# Patient Record
Sex: Male | Born: 1970 | Race: White | Hispanic: No | Marital: Married | Smoking: Former smoker
Health system: Southern US, Community
[De-identification: ages and names within clinical notes are randomized; demographics above are authoritative.]

## PROBLEM LIST (undated history)

## (undated) DIAGNOSIS — N200 Calculus of kidney: Secondary | ICD-10-CM

## (undated) DIAGNOSIS — D1802 Hemangioma of intracranial structures: Secondary | ICD-10-CM

## (undated) DIAGNOSIS — R569 Unspecified convulsions: Secondary | ICD-10-CM

## (undated) HISTORY — PX: VASECTOMY: SHX75

## (undated) HISTORY — PX: ADENOIDECTOMY: SUR15

---

## 1998-04-08 DIAGNOSIS — D1802 Hemangioma of intracranial structures: Secondary | ICD-10-CM

## 1998-04-08 HISTORY — DX: Hemangioma of intracranial structures: D18.02

## 2000-12-28 ENCOUNTER — Encounter: Payer: Self-pay | Admitting: Emergency Medicine

## 2000-12-28 ENCOUNTER — Emergency Department (HOSPITAL_COMMUNITY): Admission: EM | Admit: 2000-12-28 | Discharge: 2000-12-28 | Payer: Self-pay

## 2001-01-19 ENCOUNTER — Encounter: Payer: Self-pay | Admitting: Urology

## 2001-01-19 ENCOUNTER — Encounter: Admission: RE | Admit: 2001-01-19 | Discharge: 2001-01-19 | Payer: Self-pay | Admitting: Urology

## 2003-05-30 ENCOUNTER — Ambulatory Visit (HOSPITAL_COMMUNITY): Admission: RE | Admit: 2003-05-30 | Discharge: 2003-05-30 | Payer: Self-pay | Admitting: Family Medicine

## 2006-10-22 ENCOUNTER — Ambulatory Visit (HOSPITAL_COMMUNITY): Admission: RE | Admit: 2006-10-22 | Discharge: 2006-10-22 | Payer: Self-pay | Admitting: Family Medicine

## 2007-04-09 HISTORY — PX: EYE SURGERY: SHX253

## 2010-04-28 ENCOUNTER — Encounter: Payer: Self-pay | Admitting: Family Medicine

## 2010-12-25 ENCOUNTER — Other Ambulatory Visit: Payer: Self-pay | Admitting: Otolaryngology

## 2010-12-25 DIAGNOSIS — R51 Headache: Secondary | ICD-10-CM

## 2010-12-27 ENCOUNTER — Ambulatory Visit
Admission: RE | Admit: 2010-12-27 | Discharge: 2010-12-27 | Disposition: A | Discharge status: Home / Self Care | Payer: BC Managed Care – PPO | Source: Ambulatory Visit | Attending: Otolaryngology | Admitting: Otolaryngology

## 2010-12-27 DIAGNOSIS — R51 Headache: Secondary | ICD-10-CM

## 2010-12-27 MED ORDER — GADOBENATE DIMEGLUMINE 529 MG/ML IV SOLN
14.0000 mL | Freq: Once | INTRAVENOUS | Status: AC | PRN
  Administered 2010-12-27: 14 mL via INTRAVENOUS

## 2011-04-09 HISTORY — PX: SINUS EXPLORATION: SHX5214

## 2012-11-11 ENCOUNTER — Telehealth (INDEPENDENT_AMBULATORY_CARE_PROVIDER_SITE_OTHER): Payer: Self-pay

## 2012-11-11 ENCOUNTER — Encounter (INDEPENDENT_AMBULATORY_CARE_PROVIDER_SITE_OTHER): Payer: Self-pay | Admitting: General Surgery

## 2012-11-11 ENCOUNTER — Ambulatory Visit (INDEPENDENT_AMBULATORY_CARE_PROVIDER_SITE_OTHER): Payer: BC Managed Care – PPO | Admitting: General Surgery

## 2012-11-11 VITALS — BP 108/70 | HR 58 | Resp 16 | Ht 68.0 in | Wt 147.0 lb

## 2012-11-11 DIAGNOSIS — K649 Unspecified hemorrhoids: Secondary | ICD-10-CM

## 2012-11-11 MED ORDER — HYDROCORTISONE 2.5 % RE CREA: TOPICAL_CREAM | Freq: Two times a day (BID) | RECTAL | Status: AC

## 2012-11-11 NOTE — Patient Instructions (Signed)
Hemorrhoids Hemorrhoids are swollen veins around the rectum or anus. There are two types of hemorrhoids:   Internal hemorrhoids. These occur in the veins just inside the rectum. They may poke through to the outside and become irritated and painful.  External hemorrhoids. These occur in the veins outside the anus and can be felt as a painful swelling or hard lump near the anus. CAUSES  Pregnancy.   Obesity.   Constipation or diarrhea.   Straining to have a bowel movement.   Sitting for long periods on the toilet.  Heavy lifting or other activity that caused you to strain.  Anal intercourse. SYMPTOMS   Pain.   Anal itching or irritation.   Rectal bleeding.   Fecal leakage.   Anal swelling.   One or more lumps around the anus.  DIAGNOSIS  Your caregiver may be able to diagnose hemorrhoids by visual examination. Other examinations or tests that may be performed include:   Examination of the rectal area with a gloved hand (digital rectal exam).   Examination of anal canal using a small tube (scope).   A blood test if you have lost a significant amount of blood.  A test to look inside the colon (sigmoidoscopy or colonoscopy). TREATMENT Most hemorrhoids can be treated at home. However, if symptoms do not seem to be getting better or if you have a lot of rectal bleeding, your caregiver may perform a procedure to help make the hemorrhoids get smaller or remove them completely. Possible treatments include:   Placing a rubber band at the base of the hemorrhoid to cut off the circulation (rubber band ligation).   Injecting a chemical to shrink the hemorrhoid (sclerotherapy).   Using a tool to burn the hemorrhoid (infrared light therapy).   Surgically removing the hemorrhoid (hemorrhoidectomy).   Stapling the hemorrhoid to block blood flow to the tissue (hemorrhoid stapling).  HOME CARE INSTRUCTIONS   Eat foods with fiber, such as whole grains, beans,  nuts, fruits, and vegetables. Ask your doctor about taking products with added fiber in them (fibersupplements).  Increase fluid intake. Drink enough water and fluids to keep your urine clear or pale yellow.   Exercise regularly.   Go to the bathroom when you have the urge to have a bowel movement. Do not wait.   Avoid straining to have bowel movements.   Keep the anal area dry and clean. Use wet toilet paper or moist towelettes after a bowel movement.   Medicated creams and suppositories may be used or applied as directed.   Only take over-the-counter or prescription medicines as directed by your caregiver.   Take warm sitz baths for 15 20 minutes, 3 4 times a day to ease pain and discomfort.   Place ice packs on the hemorrhoids if they are tender and swollen. Using ice packs between sitz baths may be helpful.   Put ice in a plastic bag.   Place a towel between your skin and the bag.   Leave the ice on for 15 20 minutes, 3 4 times a day.   Do not use a donut-shaped pillow or sit on the toilet for long periods. This increases blood pooling and pain.  SEEK MEDICAL CARE IF:  You have increasing pain and swelling that is not controlled by treatment or medicine.  You have uncontrolled bleeding.  You have difficulty or you are unable to have a bowel movement.  You have pain or inflammation outside the area of the hemorrhoids. MAKE SURE YOU:    Understand these instructions.  Will watch your condition.  Will get help right away if you are not doing well or get worse. Document Released: 03/22/2000 Document Revised: 03/11/2012 Document Reviewed: 01/28/2012 [employer] Patient Information 2014 [employer].  

## 2012-11-11 NOTE — Telephone Encounter (Signed)
Called and spoke to [place] in medical records requesting a copy of patient referral records to be faxed to our office for appointment today @ 9:15 am

## 2012-11-11 NOTE — Progress Notes (Signed)
Subjective:   hemorrhoids anal discomfort  Patient ID: Joseph Wiley, male   DOB: [birth year], 42 y.o.   MRN: [id number]  HPI Patient is a 42 year old male referred by Dr. Thea Silversmith do to anal discomfort and probable hemorrhoids. The patient states he has had symptoms since last fall, almost one year. He describes mainly anal discomfort. This is not severe pain but poorly characterized possibly pressure. It is worse with physical activity. He has an occasional small amount of bleeding or drainage. Bowel movements are not painful. He denies diarrhea or significant constipation. He has used over-the-counter medication and sitz baths with actually some relief in the last few weeks. He has no previous history of similar symptoms or any GI complaints or previous surgical medical treatment for her anorectal disease.  History reviewed. No pertinent past medical history. Past Surgical History  Procedure Laterality Date  . Sinus exploration     No current outpatient prescriptions on file.   No current facility-administered medications for this visit.   No Known Allergies History  Substance Use Topics  . Smoking status: Former Smoker    Quit date: 11/11/1997  . Smokeless tobacco: Never Used  . Alcohol Use: 1.2 oz/week    2 Cans of beer per week     Review of Systems  Gastrointestinal: Positive for anal bleeding. Negative for nausea, abdominal pain, diarrhea and constipation.       Objective:   Physical Exam BP 108/70  Pulse 58  Resp 16  Ht 5\' 8"  (1.727 m)  Wt 147 lb (66.679 kg)  BMI 22.36 kg/m2 General: Thin fit appearing Caucasian male in no distress Abdomen: Soft and nontender. No masses or organomegaly. Rectal: There are some minimal noninflamed external skin tags on external exam. Digital exam unremarkable. No masses or tenderness or blood.  Anoscopy was performed. There are some mild to moderate internal hemorrhoids without bleeding or inflammation. No fissure or other  abnormality    Assessment:     Rectal discomfort. Possible skin tags versus hemorrhoids. On my exam he does not have significant external hemorrhoids. He is feeling better currently with nonsurgical management.    Plan:     For now I would not recommend surgical treatment. We will try Anusol HC cream as needed. He'll take a stool softener daily. We discussed hygiene. I given an appointment for 2 months for followup that he can cancel if he is feeling significantly better.

## 2013-01-08 ENCOUNTER — Encounter (INDEPENDENT_AMBULATORY_CARE_PROVIDER_SITE_OTHER): Payer: BC Managed Care – PPO | Admitting: General Surgery

## 2013-01-14 ENCOUNTER — Encounter (INDEPENDENT_AMBULATORY_CARE_PROVIDER_SITE_OTHER): Payer: Self-pay | Admitting: General Surgery

## 2013-08-11 ENCOUNTER — Other Ambulatory Visit: Payer: Self-pay | Admitting: Orthopaedic Surgery

## 2013-08-11 DIAGNOSIS — M25511 Pain in right shoulder: Secondary | ICD-10-CM

## 2013-08-14 ENCOUNTER — Ambulatory Visit
Admission: RE | Admit: 2013-08-14 | Discharge: 2013-08-14 | Disposition: A | Discharge status: Home / Self Care | Payer: BC Managed Care – PPO | Source: Ambulatory Visit | Attending: Orthopaedic Surgery | Admitting: Orthopaedic Surgery

## 2013-08-14 DIAGNOSIS — M25511 Pain in right shoulder: Secondary | ICD-10-CM

## 2013-08-16 ENCOUNTER — Other Ambulatory Visit: Payer: Self-pay

## 2013-08-18 ENCOUNTER — Other Ambulatory Visit: Payer: Self-pay | Admitting: Orthopedic Surgery

## 2013-08-18 DIAGNOSIS — R29898 Other symptoms and signs involving the musculoskeletal system: Secondary | ICD-10-CM

## 2013-08-22 ENCOUNTER — Ambulatory Visit
Admission: RE | Admit: 2013-08-22 | Discharge: 2013-08-22 | Disposition: A | Discharge status: Home / Self Care | Payer: BC Managed Care – PPO | Source: Ambulatory Visit | Attending: Orthopedic Surgery | Admitting: Orthopedic Surgery

## 2013-08-22 DIAGNOSIS — R29898 Other symptoms and signs involving the musculoskeletal system: Secondary | ICD-10-CM

## 2013-08-27 ENCOUNTER — Other Ambulatory Visit: Payer: Self-pay | Admitting: Neurological Surgery

## 2013-09-15 ENCOUNTER — Other Ambulatory Visit (HOSPITAL_COMMUNITY): Payer: Self-pay | Admitting: *Deleted

## 2013-09-15 ENCOUNTER — Encounter (HOSPITAL_COMMUNITY): Payer: Self-pay

## 2013-09-15 ENCOUNTER — Encounter (HOSPITAL_COMMUNITY)
Admission: RE | Admit: 2013-09-15 | Discharge: 2013-09-15 | Disposition: A | Discharge status: Home / Self Care | Payer: BC Managed Care – PPO | Source: Ambulatory Visit | Attending: Neurological Surgery | Admitting: Neurological Surgery

## 2013-09-15 ENCOUNTER — Encounter (HOSPITAL_COMMUNITY): Payer: Self-pay | Admitting: Pharmacy Technician

## 2013-09-15 ENCOUNTER — Ambulatory Visit (HOSPITAL_COMMUNITY)
Admission: RE | Admit: 2013-09-15 | Discharge: 2013-09-15 | Disposition: A | Discharge status: Home / Self Care | Payer: BC Managed Care – PPO | Source: Ambulatory Visit | Attending: Neurological Surgery | Admitting: Neurological Surgery

## 2013-09-15 DIAGNOSIS — Z01812 Encounter for preprocedural laboratory examination: Secondary | ICD-10-CM | POA: Insufficient documentation

## 2013-09-15 DIAGNOSIS — Z87891 Personal history of nicotine dependence: Secondary | ICD-10-CM | POA: Insufficient documentation

## 2013-09-15 DIAGNOSIS — Z01818 Encounter for other preprocedural examination: Secondary | ICD-10-CM | POA: Insufficient documentation

## 2013-09-15 HISTORY — DX: Unspecified convulsions: R56.9

## 2013-09-15 HISTORY — DX: Hemangioma of intracranial structures: D18.02

## 2013-09-15 HISTORY — DX: Calculus of kidney: N20.0

## 2013-09-15 LAB — CBC WITH DIFFERENTIAL/PLATELET
BASOS ABS: 0 10*3/uL (ref 0.0–0.1)
BASOS PCT: 1 % (ref 0–1)
EOS ABS: 0.4 10*3/uL (ref 0.0–0.7)
EOS PCT: 6 % — AB (ref 0–5)
HCT: 42.8 % (ref 39.0–52.0)
Hemoglobin: 15 g/dL (ref 13.0–17.0)
LYMPHS PCT: 33 % (ref 12–46)
Lymphs Abs: 2.1 10*3/uL (ref 0.7–4.0)
MCH: 31.9 pg (ref 26.0–34.0)
MCHC: 35 g/dL (ref 30.0–36.0)
MCV: 91.1 fL (ref 78.0–100.0)
Monocytes Absolute: 0.6 10*3/uL (ref 0.1–1.0)
Monocytes Relative: 9 % (ref 3–12)
Neutro Abs: 3.3 10*3/uL (ref 1.7–7.7)
Neutrophils Relative %: 52 % (ref 43–77)
PLATELETS: 117 10*3/uL — AB (ref 150–400)
RBC: 4.7 MIL/uL (ref 4.22–5.81)
RDW: 12.5 % (ref 11.5–15.5)
WBC: 6.4 10*3/uL (ref 4.0–10.5)

## 2013-09-15 LAB — BASIC METABOLIC PANEL
BUN: 16 mg/dL (ref 6–23)
CALCIUM: 9.6 mg/dL (ref 8.4–10.5)
CO2: 29 meq/L (ref 19–32)
CREATININE: 0.83 mg/dL (ref 0.50–1.35)
Chloride: 103 mEq/L (ref 96–112)
GFR calc Af Amer: >90 mL/min (ref >90)
GFR calc non Af Amer: >90 mL/min (ref >90)
Glucose, Bld: 72 mg/dL (ref 70–99)
Potassium: 4.6 mEq/L (ref 3.7–5.3)
Sodium: 142 mEq/L (ref 137–147)

## 2013-09-15 LAB — PROTIME-INR
INR: 1.03 (ref 0.00–1.49)
PROTHROMBIN TIME: 13.3 s (ref 11.6–15.2)

## 2013-09-15 LAB — SURGICAL PCR SCREEN
MRSA, PCR: NEGATIVE
STAPHYLOCOCCUS AUREUS: NEGATIVE

## 2013-09-15 NOTE — Pre-Procedure Instructions (Signed)
Joseph Wiley  09/15/2013   Your procedure is scheduled on:  Friday, September 24, 2013 at 7:30 AM.   Report to [facility] Entrance "A" Admitting Office at 5:30 AM.   Call this number if you have problems the morning of surgery: [phone]   Remember:   Do not eat food or drink liquids after midnight Thursday, 09/23/13.   Take these medicines the morning of surgery with A SIP OF WATER: NONE  Stop Ibuprofen as of Friday, 09/17/13.   Do not wear jewelry.  Do not wear lotions, powders, or cologne. You may wear deodorant.  Men may shave face and neck.  Do not bring valuables to the hospital.  [facility] is not responsible                  for any belongings or valuables.               Contacts, dentures or bridgework may not be worn into surgery.  Leave suitcase in the car. After surgery it may be brought to your room.  For patients admitted to the hospital, discharge time is determined by your                treatment team.              Special Instructions: [facility] - Preparing for Surgery  Before surgery, you can play an important role.  Because skin is not sterile, your skin needs to be as free of germs as possible.  You can reduce the number of germs on you skin by washing with CHG (chlorahexidine gluconate) soap before surgery.  CHG is an antiseptic cleaner which kills germs and bonds with the skin to continue killing germs even after washing.  Please DO NOT use if you have an allergy to CHG or antibacterial soaps.  If your skin becomes reddened/irritated stop using the CHG and inform your nurse when you arrive at Short Stay.  Do not shave (including legs and underarms) for at least 48 hours prior to the first CHG shower.  You may shave your face.  Please follow these instructions carefully:   1.  Shower with CHG Soap the night before surgery and the                                morning of Surgery.  2.  If you choose to wash your hair, wash your hair first as usual  with your       normal shampoo.  3.  After you shampoo, rinse your hair and body thoroughly to remove the                      Shampoo.  4.  Use CHG as you would any other liquid soap.  You can apply chg directly       to the skin and wash gently with scrungie or a clean washcloth.  5.  Apply the CHG Soap to your body ONLY FROM THE NECK DOWN.        Do not use on open wounds or open sores.  Avoid contact with your eyes, ears, mouth and genitals (private parts).  Wash genitals (private parts) with your normal soap.  6.  Wash thoroughly, paying special attention to the area where your surgery        will be performed.  7.  Thoroughly rinse your  body with warm water from the neck down.  8.  DO NOT shower/wash with your normal soap after using and rinsing off       the CHG Soap.  9.  Pat yourself dry with a clean towel.            10.  Wear clean pajamas.            11.  Place clean sheets on your bed the night of your first shower and do not        sleep with pets.  Day of Surgery  Do not apply any lotions the morning of surgery.  Please wear clean clothes to the hospital/surgery center.     Please read over the following fact sheets that you were given: Pain Booklet, Coughing and Deep Breathing, MRSA Information and Surgical Site Infection Prevention

## 2013-09-23 MED ORDER — CEFAZOLIN SODIUM-DEXTROSE 2-3 GM-% IV SOLR
2.0000 g | INTRAVENOUS | Status: DC
  Filled 2013-09-23: qty 50

## 2013-09-24 ENCOUNTER — Inpatient Hospital Stay (HOSPITAL_COMMUNITY): Payer: BC Managed Care – PPO

## 2013-09-24 ENCOUNTER — Encounter (HOSPITAL_COMMUNITY): Payer: BC Managed Care – PPO | Admitting: Anesthesiology

## 2013-09-24 ENCOUNTER — Ambulatory Visit (HOSPITAL_COMMUNITY)
Admission: RE | Admit: 2013-09-24 | Discharge: 2013-09-25 | Disposition: A | Discharge status: Home / Self Care | Payer: BC Managed Care – PPO | Source: Ambulatory Visit | Attending: Neurological Surgery | Admitting: Neurological Surgery

## 2013-09-24 ENCOUNTER — Encounter (HOSPITAL_COMMUNITY)
Admission: RE | Disposition: A | Discharge status: Home / Self Care | Payer: Self-pay | Source: Ambulatory Visit | Attending: Neurological Surgery

## 2013-09-24 ENCOUNTER — Inpatient Hospital Stay (HOSPITAL_COMMUNITY): Payer: BC Managed Care – PPO | Admitting: Anesthesiology

## 2013-09-24 ENCOUNTER — Encounter (HOSPITAL_COMMUNITY): Payer: Self-pay | Admitting: Neurological Surgery

## 2013-09-24 DIAGNOSIS — M5020 Other cervical disc displacement, unspecified cervical region: Secondary | ICD-10-CM | POA: Insufficient documentation

## 2013-09-24 DIAGNOSIS — M47812 Spondylosis without myelopathy or radiculopathy, cervical region: Principal | ICD-10-CM | POA: Insufficient documentation

## 2013-09-24 DIAGNOSIS — Z87891 Personal history of nicotine dependence: Secondary | ICD-10-CM | POA: Insufficient documentation

## 2013-09-24 DIAGNOSIS — Z981 Arthrodesis status: Secondary | ICD-10-CM

## 2013-09-24 HISTORY — PX: ANTERIOR CERVICAL DECOMP/DISCECTOMY FUSION: SHX1161

## 2013-09-24 SURGERY — ANTERIOR CERVICAL DECOMPRESSION/DISCECTOMY FUSION 3 LEVELS
Anesthesia: General

## 2013-09-24 MED ORDER — DEXAMETHASONE SODIUM PHOSPHATE 4 MG/ML IJ SOLN
4.0000 mg | Freq: Four times a day (QID) | INTRAMUSCULAR | Status: DC
  Administered 2013-09-24: 4 mg via INTRAVENOUS
  Filled 2013-09-24 (×4): qty 1

## 2013-09-24 MED ORDER — ONDANSETRON HCL 4 MG/2ML IJ SOLN
INTRAMUSCULAR | Status: AC
  Filled 2013-09-24: qty 2

## 2013-09-24 MED ORDER — NEOSTIGMINE METHYLSULFATE 10 MG/10ML IV SOLN
INTRAVENOUS | Status: DC | PRN
  Administered 2013-09-24: 3 mg via INTRAVENOUS

## 2013-09-24 MED ORDER — THROMBIN 5000 UNITS EX SOLR
OROMUCOSAL | Status: DC | PRN
  Administered 2013-09-24: 09:00:00 via TOPICAL

## 2013-09-24 MED ORDER — ROCURONIUM BROMIDE 50 MG/5ML IV SOLN
INTRAVENOUS | Status: AC
  Filled 2013-09-24: qty 1

## 2013-09-24 MED ORDER — LACTATED RINGERS IV SOLN
INTRAVENOUS | Status: DC | PRN
  Administered 2013-09-24 (×2): via INTRAVENOUS

## 2013-09-24 MED ORDER — SODIUM CHLORIDE 0.9 % IJ SOLN
3.0000 mL | Freq: Two times a day (BID) | INTRAMUSCULAR | Status: DC
  Administered 2013-09-24 (×2): 3 mL via INTRAVENOUS

## 2013-09-24 MED ORDER — FENTANYL CITRATE 0.05 MG/ML IJ SOLN
INTRAMUSCULAR | Status: DC | PRN
  Administered 2013-09-24 (×3): 50 ug via INTRAVENOUS
  Administered 2013-09-24: 100 ug via INTRAVENOUS
  Administered 2013-09-24: 50 ug via INTRAVENOUS

## 2013-09-24 MED ORDER — MENTHOL 3 MG MT LOZG
1.0000 | LOZENGE | OROMUCOSAL | Status: DC | PRN
  Administered 2013-09-24: 3 mg via ORAL
  Filled 2013-09-24: qty 9

## 2013-09-24 MED ORDER — LIDOCAINE HCL (CARDIAC) 20 MG/ML IV SOLN
INTRAVENOUS | Status: AC
  Filled 2013-09-24: qty 5

## 2013-09-24 MED ORDER — SODIUM CHLORIDE 0.9 % IJ SOLN: 3.0000 mL | INTRAMUSCULAR | Status: DC | PRN

## 2013-09-24 MED ORDER — OXYCODONE HCL 5 MG/5ML PO SOLN: 5.0000 mg | Freq: Once | ORAL | Status: DC | PRN

## 2013-09-24 MED ORDER — SODIUM CHLORIDE 0.9 % IJ SOLN
INTRAMUSCULAR | Status: AC
  Filled 2013-09-24: qty 10

## 2013-09-24 MED ORDER — ACETAMINOPHEN 10 MG/ML IV SOLN
INTRAVENOUS | Status: AC
  Administered 2013-09-24: 1000 mg via INTRAVENOUS
  Filled 2013-09-24: qty 100

## 2013-09-24 MED ORDER — ARTIFICIAL TEARS OP OINT
TOPICAL_OINTMENT | OPHTHALMIC | Status: AC
  Filled 2013-09-24: qty 3.5

## 2013-09-24 MED ORDER — PHENOL 1.4 % MT LIQD: 1.0000 | OROMUCOSAL | Status: DC | PRN

## 2013-09-24 MED ORDER — OXYCODONE HCL 5 MG PO TABS: 5.0000 mg | ORAL_TABLET | Freq: Once | ORAL | Status: DC | PRN

## 2013-09-24 MED ORDER — PROPOFOL 10 MG/ML IV BOLUS
INTRAVENOUS | Status: AC
  Filled 2013-09-24: qty 20

## 2013-09-24 MED ORDER — ACETAMINOPHEN 10 MG/ML IV SOLN
1000.0000 mg | Freq: Four times a day (QID) | INTRAVENOUS | Status: DC
Start: 2013-09-24 — End: 2013-09-24

## 2013-09-24 MED ORDER — HYDROMORPHONE HCL PF 1 MG/ML IJ SOLN: 0.2500 mg | INTRAMUSCULAR | Status: DC | PRN

## 2013-09-24 MED ORDER — POTASSIUM CHLORIDE IN NACL 20-0.9 MEQ/L-% IV SOLN
INTRAVENOUS | Status: DC
  Filled 2013-09-24 (×3): qty 1000

## 2013-09-24 MED ORDER — FENTANYL CITRATE 0.05 MG/ML IJ SOLN
INTRAMUSCULAR | Status: AC
  Filled 2013-09-24: qty 5

## 2013-09-24 MED ORDER — ONDANSETRON HCL 4 MG/2ML IJ SOLN: 4.0000 mg | INTRAMUSCULAR | Status: DC | PRN

## 2013-09-24 MED ORDER — BUPIVACAINE HCL (PF) 0.25 % IJ SOLN
INTRAMUSCULAR | Status: DC | PRN
  Administered 2013-09-24: 3.2 mL

## 2013-09-24 MED ORDER — GLYCOPYRROLATE 0.2 MG/ML IJ SOLN
INTRAMUSCULAR | Status: AC
  Filled 2013-09-24: qty 2

## 2013-09-24 MED ORDER — LIDOCAINE HCL (CARDIAC) 20 MG/ML IV SOLN
INTRAVENOUS | Status: DC | PRN
  Administered 2013-09-24: 80 mg via INTRAVENOUS

## 2013-09-24 MED ORDER — ACETAMINOPHEN 650 MG RE SUPP: 650.0000 mg | RECTAL | Status: DC | PRN

## 2013-09-24 MED ORDER — MIDAZOLAM HCL 5 MG/5ML IJ SOLN
INTRAMUSCULAR | Status: DC | PRN
  Administered 2013-09-24: 2 mg via INTRAVENOUS

## 2013-09-24 MED ORDER — DEXAMETHASONE SODIUM PHOSPHATE 10 MG/ML IJ SOLN
INTRAMUSCULAR | Status: AC
  Filled 2013-09-24: qty 1

## 2013-09-24 MED ORDER — EPHEDRINE SULFATE 50 MG/ML IJ SOLN
INTRAMUSCULAR | Status: DC | PRN
  Administered 2013-09-24 (×2): 10 mg via INTRAVENOUS

## 2013-09-24 MED ORDER — DEXAMETHASONE 4 MG PO TABS
4.0000 mg | ORAL_TABLET | Freq: Four times a day (QID) | ORAL | Status: DC
  Administered 2013-09-24 – 2013-09-25 (×3): 4 mg via ORAL
  Filled 2013-09-24 (×4): qty 1

## 2013-09-24 MED ORDER — EPHEDRINE SULFATE 50 MG/ML IJ SOLN
INTRAMUSCULAR | Status: AC
  Filled 2013-09-24: qty 1

## 2013-09-24 MED ORDER — CYCLOBENZAPRINE HCL 10 MG PO TABS
10.0000 mg | ORAL_TABLET | Freq: Three times a day (TID) | ORAL | Status: DC | PRN
  Administered 2013-09-24 (×2): 10 mg via ORAL
  Filled 2013-09-24 (×2): qty 1

## 2013-09-24 MED ORDER — GLYCOPYRROLATE 0.2 MG/ML IJ SOLN
INTRAMUSCULAR | Status: DC | PRN
  Administered 2013-09-24: 0.4 mg via INTRAVENOUS

## 2013-09-24 MED ORDER — SODIUM CHLORIDE 0.9 % IR SOLN
Status: DC | PRN
  Administered 2013-09-24: 09:00:00

## 2013-09-24 MED ORDER — ONDANSETRON HCL 4 MG/2ML IJ SOLN: 4.0000 mg | Freq: Four times a day (QID) | INTRAMUSCULAR | Status: DC | PRN

## 2013-09-24 MED ORDER — MORPHINE SULFATE 2 MG/ML IJ SOLN: 1.0000 mg | INTRAMUSCULAR | Status: DC | PRN

## 2013-09-24 MED ORDER — HYDROCODONE-ACETAMINOPHEN 5-325 MG PO TABS
1.0000 | ORAL_TABLET | ORAL | Status: DC | PRN
  Administered 2013-09-24 (×2): 2 via ORAL
  Filled 2013-09-24 (×2): qty 2

## 2013-09-24 MED ORDER — ONDANSETRON HCL 4 MG/2ML IJ SOLN
INTRAMUSCULAR | Status: DC | PRN
Start: 2013-09-24 — End: 2013-09-24
  Administered 2013-09-24: 4 mg via INTRAVENOUS

## 2013-09-24 MED ORDER — ROCURONIUM BROMIDE 100 MG/10ML IV SOLN
INTRAVENOUS | Status: DC | PRN
  Administered 2013-09-24: 50 mg via INTRAVENOUS

## 2013-09-24 MED ORDER — SENNA 8.6 MG PO TABS
1.0000 | ORAL_TABLET | Freq: Two times a day (BID) | ORAL | Status: DC
  Administered 2013-09-24: 8.6 mg via ORAL
  Filled 2013-09-24 (×3): qty 1

## 2013-09-24 MED ORDER — ACETAMINOPHEN 325 MG PO TABS: 650.0000 mg | ORAL_TABLET | ORAL | Status: DC | PRN

## 2013-09-24 MED ORDER — NEOSTIGMINE METHYLSULFATE 10 MG/10ML IV SOLN
INTRAVENOUS | Status: AC
  Filled 2013-09-24: qty 1

## 2013-09-24 MED ORDER — PROPOFOL 10 MG/ML IV BOLUS
INTRAVENOUS | Status: DC | PRN
  Administered 2013-09-24: 200 mg via INTRAVENOUS

## 2013-09-24 MED ORDER — 0.9 % SODIUM CHLORIDE (POUR BTL) OPTIME
TOPICAL | Status: DC | PRN
  Administered 2013-09-24: 1000 mL

## 2013-09-24 MED ORDER — THROMBIN 20000 UNITS EX SOLR
CUTANEOUS | Status: DC | PRN
  Administered 2013-09-24: 09:00:00 via TOPICAL

## 2013-09-24 MED ORDER — MIDAZOLAM HCL 2 MG/2ML IJ SOLN
INTRAMUSCULAR | Status: AC
  Filled 2013-09-24: qty 2

## 2013-09-24 MED ORDER — CEFAZOLIN SODIUM 1-5 GM-% IV SOLN
1.0000 g | Freq: Three times a day (TID) | INTRAVENOUS | Status: AC
  Administered 2013-09-24 (×2): 1 g via INTRAVENOUS
  Filled 2013-09-24 (×2): qty 50

## 2013-09-24 MED ORDER — MORPHINE SULFATE 2 MG/ML IJ SOLN
INTRAMUSCULAR | Status: AC
  Administered 2013-09-24: 2 mg via INTRAVENOUS
  Filled 2013-09-24: qty 1

## 2013-09-24 MED ORDER — DEXAMETHASONE SODIUM PHOSPHATE 10 MG/ML IJ SOLN
10.0000 mg | INTRAMUSCULAR | Status: AC
  Administered 2013-09-24: 10 mg via INTRAVENOUS

## 2013-09-24 SURGICAL SUPPLY — 56 items
BAG DECANTER FOR FLEXI CONT (MISCELLANEOUS) ×3 IMPLANT
BENZOIN TINCTURE PRP APPL 2/3 (GAUZE/BANDAGES/DRESSINGS) ×3 IMPLANT
BIT DRILL POWER (BIT) ×1 IMPLANT
BLADE 10 SAFETY STRL DISP (BLADE) ×3 IMPLANT
BONE MATRIX OSTEOCEL PRO SM (Bone Implant) ×6 IMPLANT
BUR MATCHSTICK NEURO 3.0 LAGG (BURR) ×3 IMPLANT
CAGE SMALL 7X13X15 (Cage) ×9 IMPLANT
CANISTER SUCT 3000ML (MISCELLANEOUS) ×3 IMPLANT
CLOSURE WOUND 1/2 X4 (GAUZE/BANDAGES/DRESSINGS) ×1
CONT SPEC 4OZ CLIKSEAL STRL BL (MISCELLANEOUS) ×3 IMPLANT
DRAPE C-ARM 42X72 X-RAY (DRAPES) ×6 IMPLANT
DRAPE LAPAROTOMY 100X72 PEDS (DRAPES) ×3 IMPLANT
DRAPE MICROSCOPE ZEISS OPMI (DRAPES) ×3 IMPLANT
DRAPE POUCH INSTRU U-SHP 10X18 (DRAPES) ×3 IMPLANT
DRILL BIT POWER (BIT) ×2
DRSG OPSITE 4X5.5 SM (GAUZE/BANDAGES/DRESSINGS) ×3 IMPLANT
DRSG OPSITE POSTOP 3X4 (GAUZE/BANDAGES/DRESSINGS) ×3 IMPLANT
DRSG TELFA 3X8 NADH (GAUZE/BANDAGES/DRESSINGS) ×3 IMPLANT
DURAPREP 6ML APPLICATOR 50/CS (WOUND CARE) ×3 IMPLANT
ELECT COATED BLADE 2.86 ST (ELECTRODE) ×3 IMPLANT
ELECT REM PT RETURN 9FT ADLT (ELECTROSURGICAL) ×3
ELECTRODE REM PT RTRN 9FT ADLT (ELECTROSURGICAL) ×1 IMPLANT
GAUZE SPONGE 4X4 16PLY XRAY LF (GAUZE/BANDAGES/DRESSINGS) IMPLANT
GLOVE BIO SURGEON STRL SZ8 (GLOVE) ×6 IMPLANT
GLOVE BIOGEL PI IND STRL 7.5 (GLOVE) ×3 IMPLANT
GLOVE BIOGEL PI INDICATOR 7.5 (GLOVE) ×6
GLOVE ECLIPSE 7.0 STRL STRAW (GLOVE) ×3 IMPLANT
GLOVE SURG SS PI 7.0 STRL IVOR (GLOVE) ×12 IMPLANT
GOWN STRL REUS W/ TWL LRG LVL3 (GOWN DISPOSABLE) ×2 IMPLANT
GOWN STRL REUS W/ TWL XL LVL3 (GOWN DISPOSABLE) ×3 IMPLANT
GOWN STRL REUS W/TWL 2XL LVL3 (GOWN DISPOSABLE) IMPLANT
GOWN STRL REUS W/TWL LRG LVL3 (GOWN DISPOSABLE) ×4
GOWN STRL REUS W/TWL XL LVL3 (GOWN DISPOSABLE) ×6
HALTER HD/CHIN CERV TRACTION D (MISCELLANEOUS) IMPLANT
HEMOSTAT POWDER KIT SURGIFOAM (HEMOSTASIS) ×3 IMPLANT
KIT BASIN OR (CUSTOM PROCEDURE TRAY) ×3 IMPLANT
KIT ROOM TURNOVER OR (KITS) ×3 IMPLANT
NEEDLE HYPO 25X1 1.5 SAFETY (NEEDLE) ×3 IMPLANT
NEEDLE SPNL 20GX3.5 QUINCKE YW (NEEDLE) ×3 IMPLANT
NS IRRIG 1000ML POUR BTL (IV SOLUTION) ×3 IMPLANT
PACK LAMINECTOMY NEURO (CUSTOM PROCEDURE TRAY) ×3 IMPLANT
PAD ARMBOARD 7.5X6 YLW CONV (MISCELLANEOUS) ×3 IMPLANT
PIN DISTRACTOR STERILE 14MM (PIN) ×6 IMPLANT
PLATE HELIX T 58MM (Plate) ×3 IMPLANT
RUBBERBAND STERILE (MISCELLANEOUS) ×6 IMPLANT
SCREW FIXED SELF TAP 4.0X13MM (Screw) ×6 IMPLANT
SCREW FIXED SELF TAP 4X15 (Screw) ×18 IMPLANT
SPONGE INTESTINAL PEANUT (DISPOSABLE) ×3 IMPLANT
SPONGE SURGIFOAM ABS GEL 100 (HEMOSTASIS) ×3 IMPLANT
STRIP CLOSURE SKIN 1/2X4 (GAUZE/BANDAGES/DRESSINGS) ×2 IMPLANT
SUT VIC AB 3-0 SH 8-18 (SUTURE) ×3 IMPLANT
SYR 20ML ECCENTRIC (SYRINGE) IMPLANT
TOWEL OR 17X24 6PK STRL BLUE (TOWEL DISPOSABLE) ×3 IMPLANT
TOWEL OR 17X26 10 PK STRL BLUE (TOWEL DISPOSABLE) ×3 IMPLANT
TRAP SPECIMEN MUCOUS 40CC (MISCELLANEOUS) IMPLANT
WATER STERILE IRR 1000ML POUR (IV SOLUTION) ×3 IMPLANT

## 2013-09-24 NOTE — H&P (Signed)
Subjective:   Patient is a 43 y.o. male admitted for ACDF. The patient first presented to me with complaints of neck and RUE pain with C5 weakness. Onset of symptoms was a few months  ago. The pain is described as aching and occurs intermittently. The pain is rated moderate and is located in the neck and radiates to the RUE. The symptoms have been progressively better. Symptoms are exacerbated by extension, and are relieved by meds.  Previous work up includes MRI and pain films which show significant stenosis with signal change in cord.  Past Medical History  Diagnosis Date  . Kidney stone   . 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 Cavernous hemangioma of brain 2000    Past Surgical History  Procedure Laterality Date  . Sinus exploration  2013  . Adenoidectomy      age 65  . Vasectomy    . Eye surgery Bilateral 2009    Lasik    No Known Allergies  History  Substance Use Topics  . Smoking status: Former Smoker    Quit date: 11/11/1997  . Smokeless tobacco: Former Systems developer    Types: Chew    Quit date: 11/11/1997  . Alcohol Use: 2.4 oz/week    4 Cans of beer per week    Family History  Problem Relation Age of Onset  . Cancer Father     prostate   Prior to Admission medications   Medication Sig Start Date End Date Taking? Authorizing Provider  HYDROcodone-acetaminophen (NORCO/VICODIN) 5-325 MG per tablet Take 1 tablet by mouth every 6 (six) hours as needed for moderate pain.   Yes Historical Provider, MD  hydrocortisone (ANUSOL-HC) 2.5 % rectal cream Place rectally 2 (two) times daily. 11/11/12  Yes Edward Jolly, MD  ibuprofen (ADVIL,MOTRIN) 200 MG tablet Take 400 mg by mouth every 6 (six) hours as needed for moderate pain.   Yes Historical Provider, MD     Review of Systems  Positive ROS: neg  All other systems have been reviewed and were otherwise negative with the exception of those mentioned in the HPI and as  above.  Objective: Vital signs in last 24 hours: Temp:  [97.3 F (36.3 C)] 97.3 F (36.3 C) (06/19 0558) Pulse Rate:  [59] 59 (06/19 0558) Resp:  [16] 16 (06/19 0558) BP: (114)/(74) 114/74 mmHg (06/19 0558) Weight:  [67.6 kg (149 lb 0.5 oz)] 67.6 kg (149 lb 0.5 oz) (06/19 0558)  General Appearance: Alert, cooperative, no distress, appears stated age Head: Normocephalic, without obvious abnormality, atraumatic Eyes: PERRL, conjunctiva/corneas clear, EOM's intact      Neck: Supple, symmetrical, trachea midline, Back: Symmetric, no curvature, ROM normal, no CVA tenderness Lungs:  respirations unlabored Heart: Regular rate and rhythm Abdomen: Soft, non-tender Extremities: Extremities normal, atraumatic, no cyanosis or edema Pulses: 2+ and symmetric all extremities Skin: Skin color, texture, turgor normal, no rashes or lesions  NEUROLOGIC:  Mental status: Alert and oriented x4, no aphasia, good attention span, fund of knowledge and memory  Motor Exam - grossly normal except R deltoid weakness 4-/5 Sensory Exam - grossly normal Reflexes: normal Coordination - grossly normal Gait - grossly normal Balance - grossly normal Cranial Nerves: I: smell Not tested  II: visual acuity  OS: nl    OD: nl  II: visual fields Full to confrontation  II: pupils Equal, round, reactive to light  III,VII: ptosis None  III,IV,VI: extraocular muscles  Full ROM  V: mastication Normal  V: facial light touch sensation  Normal  V,VII: corneal reflex  Present  VII: facial muscle function - upper  Normal  VII: facial muscle function - lower Normal  VIII: hearing Not tested  IX: soft palate elevation  Normal  IX,X: gag reflex Present  XI: trapezius strength  5/5  XI: sternocleidomastoid strength 5/5  XI: neck flexion strength  5/5  XII: tongue strength  Normal    Data Review Lab Results  Component Value Date   WBC 6.4 09/15/2013   HGB 15.0 09/15/2013   HCT 42.8 09/15/2013   MCV 91.1 09/15/2013    PLT 117* 09/15/2013   Lab Results  Component Value Date   NA 142 09/15/2013   K 4.6 09/15/2013   CL 103 09/15/2013   CO2 29 09/15/2013   BUN 16 09/15/2013   CREATININE 0.83 09/15/2013   GLUCOSE 72 09/15/2013   Lab Results  Component Value Date   INR 1.03 09/15/2013    Assessment:   Cervical neck pain with herniated nucleus pulposus/ spondylosis/ stenosis at C45-C5-6 C6-7. Patient has failed conservative therapy. Planned surgery : ACDF with plate  Plan:   I explained the condition and procedure to the patient and answered any questions.  Patient wishes to proceed with procedure as planned. Understands risks/ benefits/ and expected or typical outcomes.  JONES,DAVID S 09/24/2013 6:07 AM

## 2013-09-24 NOTE — Plan of Care (Signed)
Problem: Consults Goal: Diagnosis - Spinal Surgery Outcome: Completed/Met Date Met:  09/24/13 Cervical Spine Fusion

## 2013-09-24 NOTE — Anesthesia Postprocedure Evaluation (Signed)
Anesthesia Post Note  Patient: Elieser Tetrick Claudio  Procedure(s) Performed: Procedure(s) (LRB): ANTERIOR CERVICAL DECOMPRESSION/DISCECTOMY FUSION 3 LEVELS cervica lfour/five, five/six, six/seven decompression fusion with peek plus plate (N/A)  Anesthesia type: General  Patient location: PACU  Post pain: Pain level controlled and Adequate analgesia  Post assessment: Post-op Vital signs reviewed, Patient's Cardiovascular Status Stable, Respiratory Function Stable, Patent Airway and Pain level controlled  Last Vitals:  Filed Vitals:   09/24/13 1143  BP:   Pulse: 76  Temp: 36.2 C  Resp: 19    Post vital signs: Reviewed and stable  Level of consciousness: awake, alert  and oriented  Complications: No apparent anesthesia complications

## 2013-09-24 NOTE — Anesthesia Preprocedure Evaluation (Signed)
Anesthesia Evaluation  Patient identified by MRN, date of birth, ID band Patient awake    Reviewed: Allergy & Precautions, H&P , NPO status , Patient's Chart, lab work & pertinent test results  Airway Mallampati: II  Neck ROM: full    Dental   Pulmonary former smoker,          Cardiovascular negative cardio ROS      Neuro/Psych Seizures -,  Had a seizure 15 years ago due to a cavernous hemangioma in the brain.    GI/Hepatic   Endo/Other    Renal/GU      Musculoskeletal   Abdominal   Peds  Hematology   Anesthesia Other Findings   Reproductive/Obstetrics                           Anesthesia Physical Anesthesia Plan  ASA: II  Anesthesia Plan: General   Post-op Pain Management:    Induction: Intravenous  Airway Management Planned: Oral ETT  Additional Equipment:   Intra-op Plan:   Post-operative Plan: Extubation in OR  Informed Consent: I have reviewed the patients History and Physical, chart, labs and discussed the procedure including the risks, benefits and alternatives for the proposed anesthesia with the patient or authorized representative who has indicated his/her understanding and acceptance.     Plan Discussed with: CRNA, Anesthesiologist and Surgeon  Anesthesia Plan Comments:         Anesthesia Quick Evaluation

## 2013-09-24 NOTE — Op Note (Signed)
09/24/2013  11:09 AM  PATIENT:  Joseph Wiley  43 y.o. male  PRE-OPERATIVE DIAGNOSIS:  Cervical spondylosis with cervical spinal stenosis C5-6, herniated nucleus pulposus C4-5 and C6-7, neck and right arm pain  POST-OPERATIVE DIAGNOSIS:  Same  PROCEDURE:  1. Decompressive anterior cervical discectomy C4-5 C5-6 C6-7, 2. Anterior cervical arthrodesis C4-5 C5-6 C6-7 utilizing peek interbody cages packed with local autograft and morcellized allograft, 3. Anterior cervical plating C4-C7 utilizing a helix translational plate  SURGEON:  Sherley Bounds, MD  ASSISTANTS: Dr. Kathyrn Sheriff  ANESTHESIA:   General  EBL: 50 ml  Total I/O In: 1200 [I.V.:1200] Out: 66 [Blood:50]  BLOOD ADMINISTERED:none  DRAINS: 7 flat JP   SPECIMEN:  No Specimen  INDICATION FOR PROCEDURE: This patient presented with neck and right arm pain with weakness in the C5 distribution. He had an MRI which showed an acute disc herniation at C4-5 on the right and bilaterally at C6-7 with spondylosis at C5-6 with signal change in the cord at C5-6. He had no myelopathy on exam. I recommended decompressive surgery in the form of ACDF with plating. Patient understood the risks, benefits, and alternatives and potential outcomes and wished to proceed.  PROCEDURE DETAILS: Patient was brought to the operating room placed under general endotracheal anesthesia. Patient was placed in the supine position on the operating room table. The neck was prepped with Duraprep and draped in a sterile fashion.   Three cc of local anesthesia was injected and a transverse incision was made on the right side of the neck.  Dissection was carried down thru the subcutaneous tissue and the platysma was  elevated, opened, and undermined with Metzenbaum scissors.  Dissection was then carried out thru an avascular plane leaving the sternocleidomastoid carotid artery and jugular vein laterally and the trachea and esophagus medially. The ventral aspect of the  vertebral column was identified and a localizing x-ray was taken. The C5-6 level was identified. The longus colli muscles were then elevated and the retractor was placed to expose C4-5 C5-6 and C6-7. The annulus was incised at each level and the disc space entered. Discectomy was performed with micro-curettes and pituitary rongeurs. I then used the high-speed drill to drill the endplates down to the level of the posterior longitudinal ligament. The drill shavings were saved in a mucous trap for later arthrodesis. The operating microscope was draped and brought into the field provided additional magnification, illumination and visualization. Discectomy was continued posteriorly thru the disc space at each level. The exact same decompression was performed at each level. Posterior longitudinal ligament was opened with a nerve hook, and then removed along with disc herniation and osteophytes, decompressing the spinal canal and thecal sac. We then continued to remove osteophytic overgrowth and disc material decompressing the neural foramina and exiting nerve roots bilaterally. The scope was angled up and down to help decompress and undercut the vertebral bodies. I found a large acute disc herniation at C4-5 on the right. I found a large acute disc herniation at C6-7 right more than left . C5-6 had more osteophytic ridging and spondylosis. Once the decompression was completed we could pass a nerve hook circumferentially to assure adequate decompression in the midline and in the neural foramina. So by both visualization and palpation we felt we had an adequate decompression of the neural elements. We then measured the height of the intravertebral disc space and selected a 7 millimeter Peek interbody cage packed with autograft and morcellized allograft. It was then gently positioned in the  intravertebral disc space and countersunk at each level. I then used a helix translational plate and placed fixed angle screws into the  vertebral bodies of C4, C5,C6, and C7 and locked them into position. The wound was irrigated with bacitracin solution, checked for hemostasis which was established and confirmed. Once meticulous hemostasis was achieved, I placed a 7 flat JP drain and we then proceeded with closure. The platysma was closed with interrupted 3-0 undyed Vicryl suture, the subcuticular layer was closed with interrupted 3-0 undyed Vicryl suture. The skin edges were approximated with steristrips. The drapes were removed. A sterile dressing was applied. The patient was then awakened from general anesthesia and transferred to the recovery room in stable condition. At the end of the procedure all sponge, needle and instrument counts were correct.   PLAN OF CARE: Admit for overnight observation  PATIENT DISPOSITION:  PACU - hemodynamically stable.   Delay start of Pharmacological VTE agent (>24hrs) due to surgical blood loss or risk of bleeding:  yes

## 2013-09-24 NOTE — Anesthesia Procedure Notes (Addendum)
Procedure Name: Intubation Date/Time: 09/24/2013 7:42 AM Performed by: Trixie Deis A Pre-anesthesia Checklist: Patient identified, Timeout performed, Emergency Drugs available, Suction available and Patient being monitored Patient Re-evaluated:Patient Re-evaluated prior to inductionOxygen Delivery Method: Circle system utilized Preoxygenation: Pre-oxygenation with 100% oxygen Intubation Type: IV induction Ventilation: Mask ventilation without difficulty Laryngoscope Size: Mac and 3 Grade View: Grade II Tube type: Oral Tube size: 7.5 mm Number of attempts: 2 (DL CRNA grade III, DL MDA grade II) Airway Equipment and Method: Stylet Placement Confirmation: ETT inserted through vocal cords under direct vision,  breath sounds checked- equal and bilateral and positive ETCO2 Secured at: 23 cm Tube secured with: Tape Dental Injury: Teeth and Oropharynx as per pre-operative assessment

## 2013-09-24 NOTE — Transfer of Care (Signed)
Immediate Anesthesia Transfer of Care Note  Patient: Joseph Wiley  Procedure(s) Performed: Procedure(s): ANTERIOR CERVICAL DECOMPRESSION/DISCECTOMY FUSION 3 LEVELS cervica lfour/five, five/six, six/seven decompression fusion with peek plus plate (N/A)  Patient Location: PACU  Anesthesia Type:General  Level of Consciousness: awake, alert  and oriented  Airway & Oxygen Therapy: Patient Spontanous Breathing and Patient connected to nasal cannula oxygen  Post-op Assessment: Report given to PACU RN, Post -op Vital signs reviewed and stable and Patient moving all extremities  Post vital signs: Reviewed and stable  Complications: No apparent anesthesia complications

## 2013-09-25 MED ORDER — HYDROCODONE-ACETAMINOPHEN 5-325 MG PO TABS: 1.0000 | ORAL_TABLET | Freq: Four times a day (QID) | ORAL | Status: AC | PRN

## 2013-09-25 MED ORDER — CYCLOBENZAPRINE HCL 10 MG PO TABS: 10.0000 mg | ORAL_TABLET | Freq: Three times a day (TID) | ORAL | Status: AC | PRN

## 2013-09-25 NOTE — Discharge Summary (Signed)
  Physician Discharge Summary  Patient ID: Joseph Wiley MRN: [id number] DOB/AGE: 1971-03-20 43 y.o.  Admit date: 09/24/2013 Discharge date: 09/25/2013  Admission Diagnoses: Cervical spondylosis with myelopathy  Discharge Diagnoses: Same Active Problems:   S/P cervical spinal fusion   Discharged Condition: Stable  Hospital Course:  Mrs. [name] is a 43 y.o. male electively admitted after uncomplicated X9-B7 ACDF. He was at baseline immediately postop. On POD#1 he was ambulating normally, voiding, tolerating diet with minimal dysphagia, and reported improvement in right arm pain. He was therefore stable for d/c.  Treatments: Surgery - C45, C56, C6-7 ACDF  Discharge Exam: Blood pressure 117/72, pulse 71, temperature 98.7 F (37.1 C), temperature source Oral, resp. rate 20, weight 67.6 kg (149 lb 0.5 oz), SpO2 96.00%. Awake, alert, oriented Speech fluent, appropriate CN grossly intact 5/5 BUE/BLE x right delt 4/5 Wound c/d/i  Follow-up: Follow-up in Dr. Ronnald Ramp' office [facility] [phone]) in 2-3 weeks  Disposition: Home     Medication List    STOP taking these medications       ibuprofen 200 MG tablet  Commonly known as:  ADVIL,MOTRIN      TAKE these medications       cyclobenzaprine 10 MG tablet  Commonly known as:  FLEXERIL  Take 1 tablet (10 mg total) by mouth 3 (three) times daily as needed for muscle spasms.     HYDROcodone-acetaminophen 5-325 MG per tablet  Commonly known as:  NORCO/VICODIN  Take 1-2 tablets by mouth every 6 (six) hours as needed for moderate pain.     hydrocortisone 2.5 % rectal cream  Commonly known as:  ANUSOL-HC  Place rectally 2 (two) times daily.           Follow-up Information   Follow up with Eustace Moore, MD.   Specialty:  Neurosurgery   Contact information:   [address] [place] [phone]       Signed: Consuella Lose, C 09/25/2013, 10:27 AM

## 2013-09-25 NOTE — Discharge Instructions (Signed)
Wound Care Keep incision covered and dry for 3 days.    Do not put any creams, lotions, or ointments on incision. Leave steri-strips on neck.  They will fall off by themselves. Activity Walk each and every day, increasing distance each day. No lifting greater than 5 lbs.  Avoid excessive neck motion. No driving for 2 weeks; may ride as a passenger locally. . Diet Resume your normal diet.  Return to Work Will be discussed at you follow up appointment. Call Your Doctor If Any of These Occur Redness, drainage, or swelling at the wound.  Temperature greater than 101 degrees. Severe pain not relieved by pain medication. Increased difficulty swallowing.  Incision starts to come apart. Follow Up Appt Call today for appointment in 1-2 weeks ([phone]) or for problems.  If you have any hardware placed in your spine, you will need an x-ray before your appointment.

## 2013-09-25 NOTE — Progress Notes (Signed)
Pt. Alert and oriented,follows simple instructions, denies pain. Incision area without swelling, redness or S/S of infection. Voiding adequate clear yellow urine. Moving all extremities well and vitals stable and documented. Drain removed and documented and dressing applied and extra supply given to patient for home use. Patient discharged home with spouse.  Anterior surgery notes instructions given to patient and family member for home safety and precautions. Pt. and family stated understanding of instructions given.

## 2013-09-26 ENCOUNTER — Encounter (HOSPITAL_COMMUNITY): Payer: Self-pay | Admitting: Neurological Surgery

## 2016-03-09 IMAGING — MR MR CERVICAL SPINE W/O CM
4 of 5 series · 20 of 48 positions shown · non-contrast
Comparison: None.

CLINICAL DATA: Neck pain for 2 months in weakness down the right
arm.

EXAM:
MRI CERVICAL SPINE WITHOUT CONTRAST
TECHNIQUE: Multiplanar, multisequence MR imaging of the cervical spine was
performed. No intravenous contrast was administered.

[Series 3: T2 · sagittal · 3.0mm · 0.41mm/px · 6 of 12 slices shown (1 of 3)]
[im 1/12]
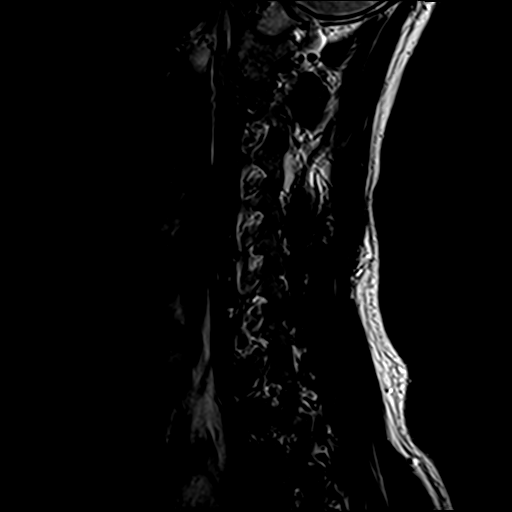
[im 3/12]
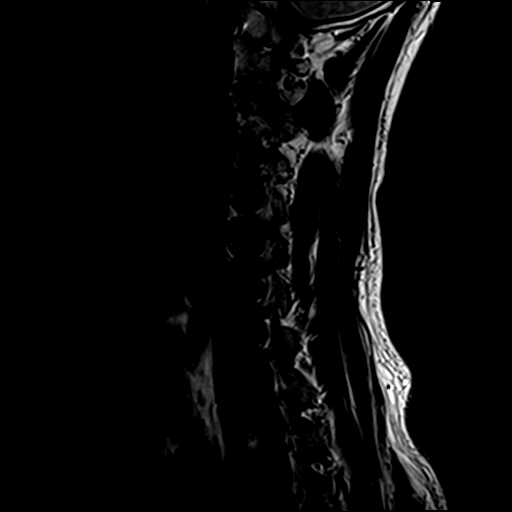
[im 5/12]
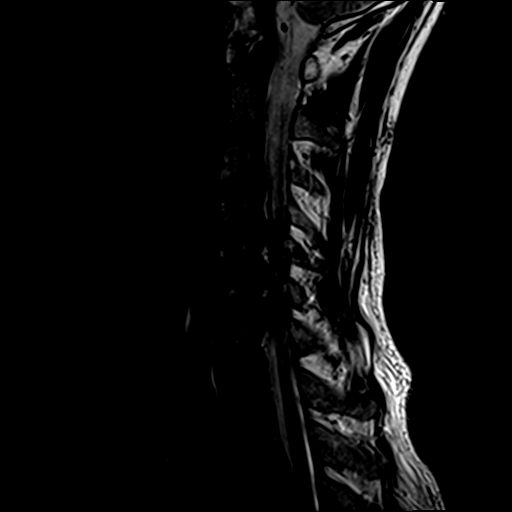
[im 7/12]
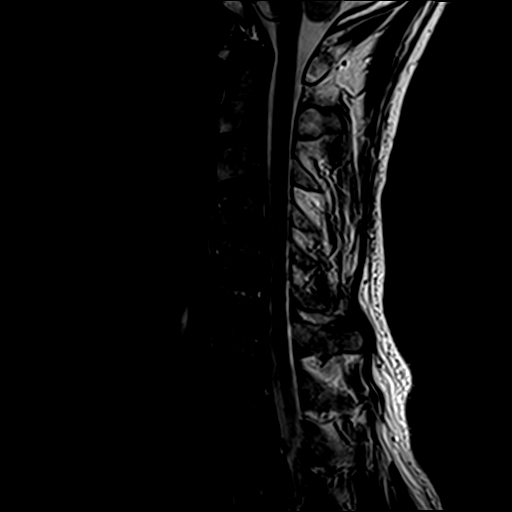
[im 9/12]
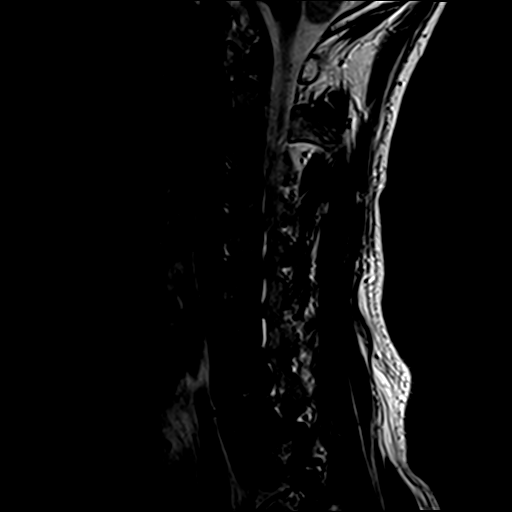
[im 12/12]
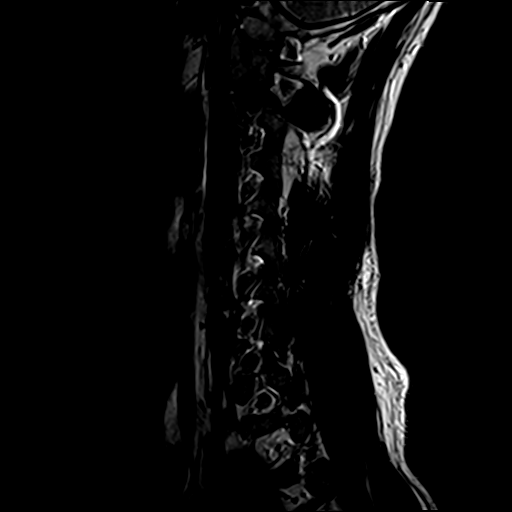

[Series 4: T1 · sagittal · 3.0mm · 0.41mm/px · 3 of 12 slices shown]
[im 3/12]
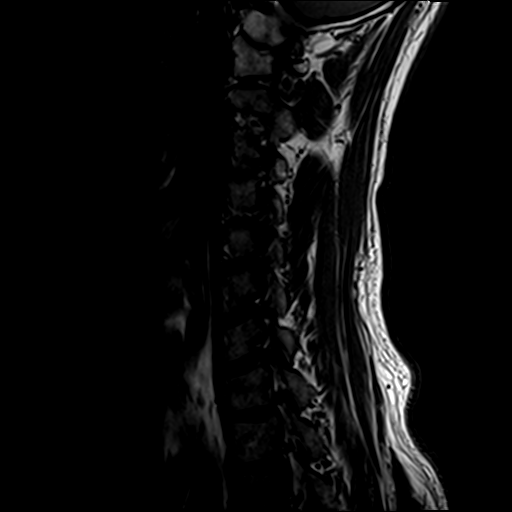
[im 7/12]
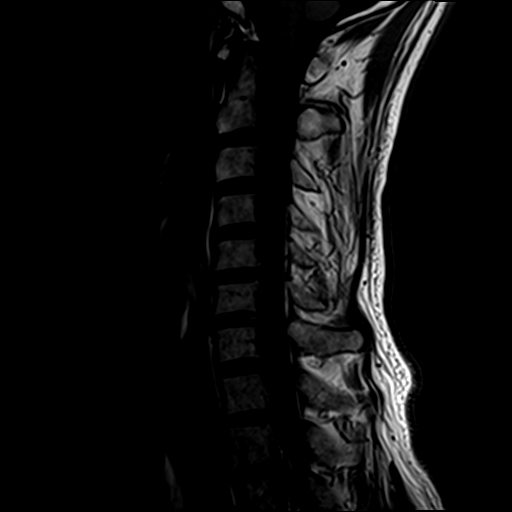
[im 12/12]
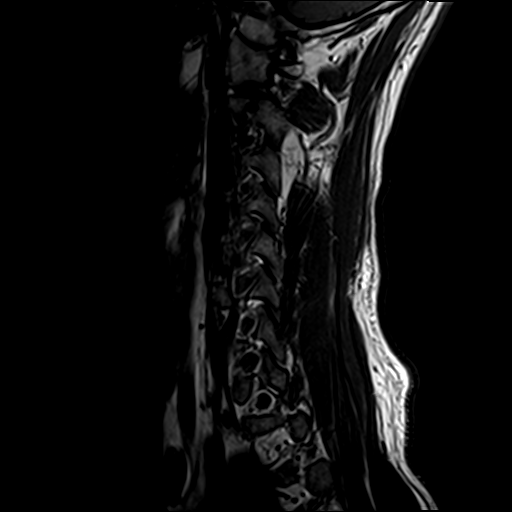

[Series 6: T2 · axial · 3.0mm · 0.39mm/px · z∈[-4,+89]mm · 8 of 30 slices shown (2 of 3)]
[im 1/30]
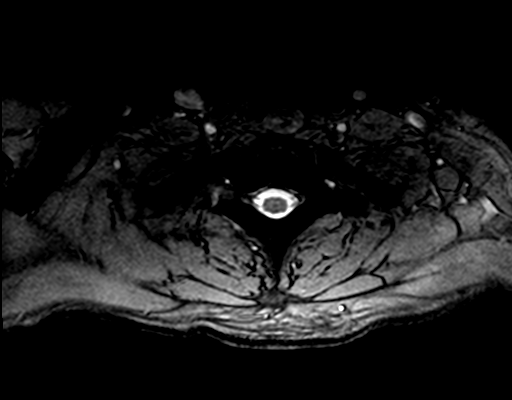
[im 5/30]
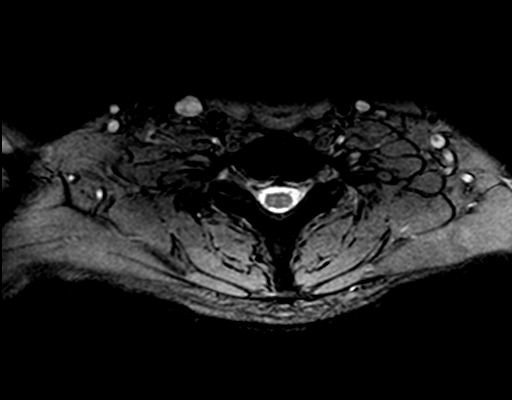
[im 9/30]
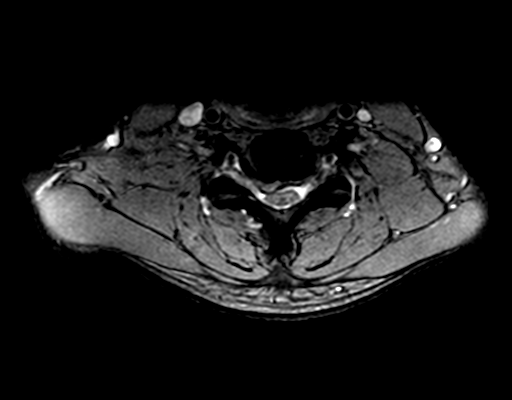
[im 13/30]
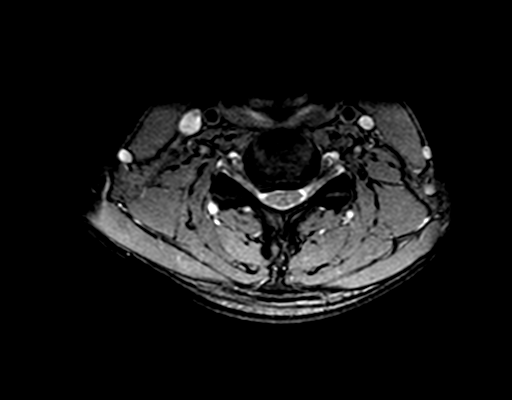
[im 15/30]
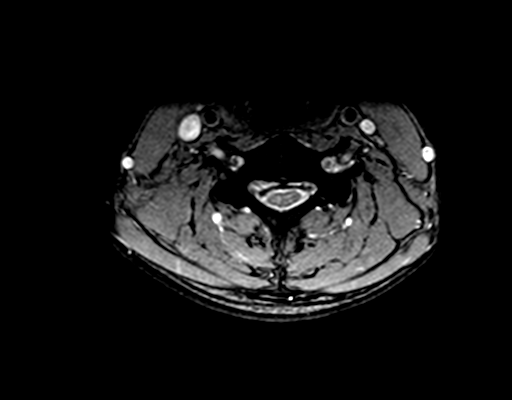
[im 17/30]
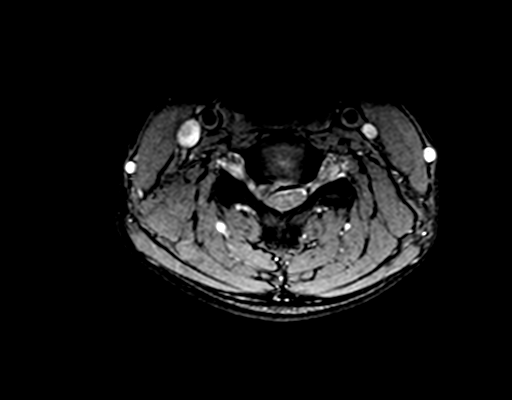
[im 21/30]
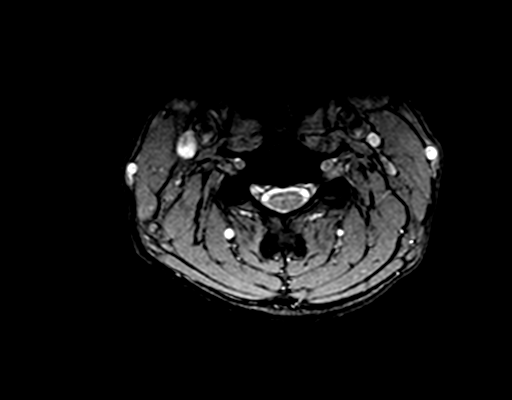
[im 25/30]
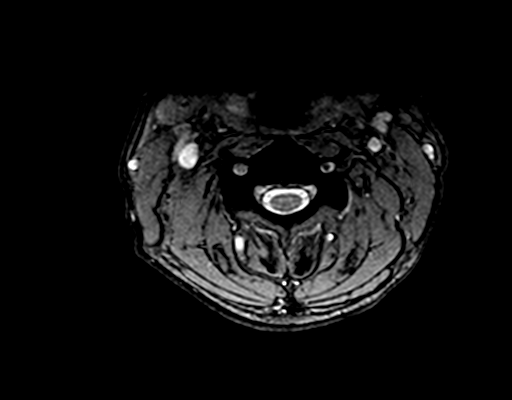

[Series 7: T2 · axial · 3.0mm · 0.39mm/px · z∈[+11,+88]mm · 3 of 30 slices shown (3 of 3)]
[im 5/30]
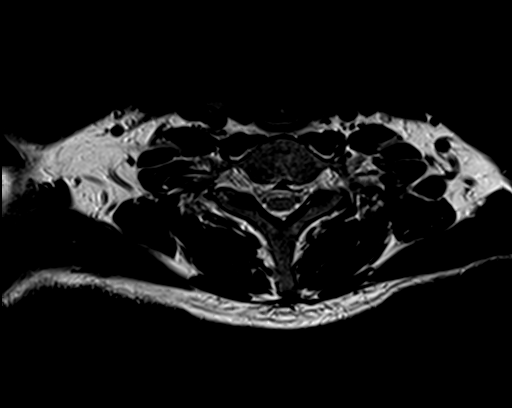
[im 15/30]
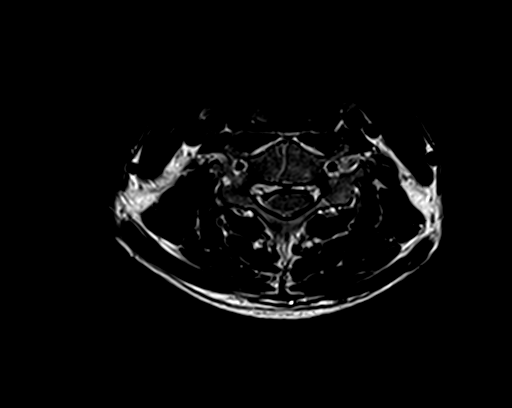
[im 25/30]
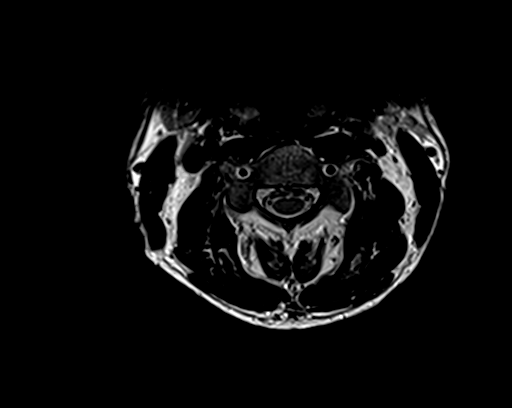

[20 of 48 positions shown; findings below may reference images not displayed]

FINDINGS: The craniocervical junction appears unremarkable. No vertebral
subluxation is observed. No significant vertebral marrow edema is
identified.

There is a 2 mm focus of abnormal cord signal in the right cord
shown on image 6 of series 5, admittedly a subtle finding. Cervical
cord signal appears otherwise normal.

The pedicles appear congenitally short in the cervical spine.
Additional findings at individual levels are as follows:

C2-3:  No impingement.  Small central disc protrusion.

C3-4: Borderline central narrowing of the thecal sac due to a
central disc protrusion which extends caudad. Mild uncinate
spurring.

C4-5: Moderate right eccentric central narrowing of the thecal sac
due to a large right lateral recess disc protrusion which extends
caudad. The C5 nerve roots appear to exit above the level of this
disc protrusion in does there is no overt foraminal stenosis but
there is considerable crowding of the right side of the spinal
canal.

C5-6: Moderate bilateral foraminal stenosis and moderate central
narrowing of the thecal sac due to posterior osseous ridging, disc
bulge, and a right paracentral annular tear. The focus of abnormal
T2 signal in the right hemi cord is at this level and may be the
result of current or prior central stenosis.

C6-7: Moderate to prominent right central narrowing of the thecal
sac along with mild bilateral foraminal stenosis due to a large
right paracentral and lateral recess disc protrusion which extends
cephalad along with uncinate spurring and disc bulge.

C7-T1: Mild left foraminal stenosis due to disc bulge and mild facet
arthropathy. Left paracentral disc protrusion noted.

T1-2: Unremarkable. This level is only included on the parasagittal
images.

T2-3: No impingement. Right paracentral disc protrusion. This level
is only included on the parasagittal images.
IMPRESSION: 1. Cervical spondylosis and degenerative disc disease is advanced
for age, and results in moderate to prominent impingement at C6-7;
moderate impingement at C4-5 and C5-6 ; and mild impingement at
C7-T1, as detailed above.
2. There is a 2 mm subtle focus abnormal cord signal eccentric to
the right at the C5-6 level. Given the stenosis at this level, this
probably represents a subtle focus of gliosis or cord edema related
to the moderate impingement. However, the lesion is nonspecific and
a demyelinating process could have a similar appearance.

## 2019-06-05 ENCOUNTER — Ambulatory Visit: Payer: BC Managed Care – PPO | Attending: Internal Medicine

## 2019-06-05 DIAGNOSIS — Z23 Encounter for immunization: Secondary | ICD-10-CM | POA: Insufficient documentation

## 2019-06-05 NOTE — Progress Notes (Signed)
2  Covid-19 Vaccination Clinic  Name:  Joseph Wiley    MRN: [id number] DOB: 1970/06/16  06/05/2019  [name] was observed post Covid-19 immunization for 15 minutes without incidence. He was provided with Vaccine Information Sheet and instruction to access the V-Safe system.   [name] was instructed to call 911 with any severe reactions post vaccine: Marland Kitchen Difficulty breathing  . Swelling of your face and throat  . A fast heartbeat  . A bad rash all over your body  . Dizziness and weakness    Immunizations Administered    Name Date Dose VIS Date Route   Pfizer COVID-19 Vaccine 06/05/2019  5:34 PM 0.3 mL 03/19/2019 Intramuscular   Manufacturer: [place]   Lot: [id number]   [place]: [id number]

## 2019-06-26 ENCOUNTER — Ambulatory Visit: Payer: BC Managed Care – PPO | Attending: Internal Medicine

## 2019-06-26 DIAGNOSIS — Z23 Encounter for immunization: Secondary | ICD-10-CM

## 2019-06-26 NOTE — Progress Notes (Signed)
   Covid-19 Vaccination Clinic  Name:  Joseph Wiley    MRN: [id number] DOB: 04/14/1970  06/26/2019  [name] was observed post Covid-19 immunization for 15 minutes without incident. He was provided with Vaccine Information Sheet and instruction to access the V-Safe system.   [name] was instructed to call 911 with any severe reactions post vaccine: Marland Kitchen Difficulty breathing  . Swelling of face and throat  . A fast heartbeat  . A bad rash all over body  . Dizziness and weakness   Immunizations Administered    Name Date Dose VIS Date Route   Pfizer COVID-19 Vaccine 06/26/2019  9:07 AM 0.3 mL 03/19/2019 Intramuscular   Manufacturer: [place]   Lot: [id number]   [place]: [id number]

## 2021-04-27 ENCOUNTER — Other Ambulatory Visit: Payer: Self-pay | Admitting: Family Medicine

## 2021-04-27 DIAGNOSIS — R2232 Localized swelling, mass and lump, left upper limb: Secondary | ICD-10-CM

## 2021-05-01 ENCOUNTER — Ambulatory Visit
Admission: RE | Admit: 2021-05-01 | Discharge: 2021-05-01 | Disposition: A | Discharge status: Home / Self Care | Payer: BC Managed Care – PPO | Source: Ambulatory Visit | Attending: Family Medicine | Admitting: Family Medicine

## 2021-05-01 ENCOUNTER — Ambulatory Visit
Admission: RE | Admit: 2021-05-01 | Discharge: 2021-05-01 | Disposition: A | Discharge status: Home / Self Care | Payer: Self-pay | Source: Ambulatory Visit | Attending: Family Medicine | Admitting: Family Medicine

## 2021-05-01 DIAGNOSIS — R2232 Localized swelling, mass and lump, left upper limb: Secondary | ICD-10-CM

## 2022-01-29 ENCOUNTER — Ambulatory Visit (INDEPENDENT_AMBULATORY_CARE_PROVIDER_SITE_OTHER): Payer: Self-pay | Admitting: Sports Medicine

## 2022-01-29 VITALS — Ht 68.0 in | Wt 150.0 lb

## 2022-01-29 DIAGNOSIS — M7582 Other shoulder lesions, left shoulder: Secondary | ICD-10-CM

## 2022-01-29 NOTE — Progress Notes (Signed)
Patient ID: Joseph Wiley, male   DOB: 20-Dec-1970, 51 y.o.   MRN: [id number]  Joseph Wiley presents today at the request of Dr. 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  He has had symptoms for several months.  Diagnosis was made via ultrasound by Dr. Sheppard Coil.  First treatment was performed as below.  I did confirm with the patient that he is not currently on any blood thinners and he does not have an artificial shoulder joint.  Treatment was performed with the arm in an externally rotated position to help expose the maximum amount of subscapularis tendon.  He tolerated his first treatment without difficulty.  He will follow-up next week for his second of 4 treatments.  Procedure: ECSWT Indications: Left shoulder calcific subscapularis tendinopathy   Procedure Details Consent: Risks of procedure as well as the alternatives and risks of each were explained to the patient.  Written consent for procedure obtained. Time Out: Verified patient identification, verified procedure, site was marked, verified correct patient position, medications/allergies/relevent history reviewed.  The area was cleaned with alcohol swab.     The left shoulder (subscapularis) was targeted for Extracorporeal shockwave therapy.    Preset: Calcific tendonitis of the shoulder Power Level: 90 Frequency: 10 Impulse/cycles: 2000 Head size: Large   Patient tolerated procedure well without immediate complications  This note was dictated using Dragon naturally speaking software and may contain errors in syntax, spelling, or content which have not been identified prior to signing this note.

## 2022-02-05 ENCOUNTER — Ambulatory Visit (INDEPENDENT_AMBULATORY_CARE_PROVIDER_SITE_OTHER): Payer: Self-pay | Admitting: Family Medicine

## 2022-02-05 DIAGNOSIS — M7582 Other shoulder lesions, left shoulder: Secondary | ICD-10-CM

## 2022-02-06 ENCOUNTER — Encounter: Payer: Self-pay | Admitting: Family Medicine

## 2022-02-06 NOTE — Progress Notes (Signed)
PCP: Thressa Sheller, MD (Inactive)  Subjective:   HPI: Patient is a 51 y.o. male here for left shoulder pain shockwave treatment.  Patient reports not much change after first shockwave treatment last week. Noticed motion is more limited, slowly worsening. Pain initially started about 3-4 months ago. He's had subacromial injection, home exercises without much benefit to date also.  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 30 tablet 0   HYDROcodone-acetaminophen (NORCO/VICODIN) 5-325 MG per tablet Take 1-2 tablets by mouth every 6 (six) hours as needed for moderate pain. 60 tablet 0   hydrocortisone (ANUSOL-HC) 2.5 % rectal cream Place rectally 2 (two) times daily. 30 g 0   No current facility-administered medications on file prior to visit.    Past Surgical History:  Procedure Laterality Date   ADENOIDECTOMY     age 51   ANTERIOR CERVICAL DECOMP/DISCECTOMY FUSION N/A 09/24/2013   Procedure: ANTERIOR CERVICAL DECOMPRESSION/DISCECTOMY FUSION 3 LEVELS cervica lfour/five, five/six, six/seven decompression fusion with peek plus plate;  Surgeon: Eustace Moore, MD;  Location: [facility];  Service: Neurosurgery;  Laterality: N/A;   EYE SURGERY Bilateral 2009   Lasik   SINUS EXPLORATION  2013   VASECTOMY      No Known Allergies  There were no vitals taken for this visit.      No data to display              No data to display              Objective:  Physical Exam:  Gen: NAD, comfortable in exam room  Left shoulder: No swelling, ecchymoses.  No gross deformity. No TTP AC, biceps tendon. ER  limited to 45 degrees compared to 80 on the right.  Flexion and abduction - lacks about 20 degrees compared to right. Negative Hawkins, Neers. Negative Yergasons. Strength 5/5 with empty can and resisted internal/external rotation. Negative apprehension. NV intact distally.   Assessment & Plan:  1. Left shoulder pain - has known calcific subscapularis tendinopathy seen on ultrasound by Dr. Sheppard Coil.  Possible he concurrently is developing adhesive capsulitis - advised if today's treatment doesn't provide benefit to consider consultation back with Dr. Sheppard Coil to discuss next steps.  Procedure: ECSWT Indications:  left subscapularis calcific tendinopathy   Procedure Details Consent: Risks of procedure as well as the alternatives and risks of each were explained to the patient.  Written consent for procedure obtained. Time Out: Verified patient identification, verified procedure, site was marked, verified correct patient position, medications/allergies/relevent history reviewed.  The area was cleaned with alcohol swab.     The left subscapularis was targeted for Extracorporeal shockwave therapy with patient's shoulder in external rotation.    Preset: calcific tendinitis of the shoulder Power Level: 100 Frequency: 10 Impulse/cycles: 2000 Head size: large   Patient tolerated procedure well without immediate complications.

## 2022-02-12 ENCOUNTER — Ambulatory Visit: Payer: BC Managed Care – PPO | Admitting: Sports Medicine

## 2022-02-19 ENCOUNTER — Ambulatory Visit (INDEPENDENT_AMBULATORY_CARE_PROVIDER_SITE_OTHER): Payer: Self-pay | Admitting: Sports Medicine

## 2022-02-19 DIAGNOSIS — M7582 Other shoulder lesions, left shoulder: Secondary | ICD-10-CM

## 2022-02-20 NOTE — Progress Notes (Signed)
Patient ID: Joseph Wiley, male   DOB: [birth year], 51 y.o.   MRN: [id number]  Joseph Wiley presents today for his third soundwave treatment for calcific subscapularis tendinopathy.  Unfortunately, he continues to have pain despite treatment for this.  A brief physical exam today does show limited external rotation passively compared to the uninvolved right shoulder.  Also some loss of motion with passive abduction.  Third soundwave treatment performed as below.  He tolerates this without difficulty.  I recommended that we forego any future treatments in favor of returning to Dr. Sheppard Coil for work-up and treatment of adhesive capsulitis.  Patient agrees with that plan  Procedure: ECSWT Indications: Calcific rotator cuff tendinopathy   Procedure Details Consent: Risks of procedure as well as the alternatives and risks of each were explained to the patient.  Written consent for procedure obtained. Time Out: Verified patient identification, verified procedure, site was marked, verified correct patient position, medications/allergies/relevent history reviewed.  The area was cleaned with alcohol swab.     The right subscapularis tendon was targeted for Extracorporeal shockwave therapy.    Preset: Calcific tendinitis of the shoulder Power Level: 100 Frequency: 10 Impulse/cycles: 2000 Head size: Large   Patient tolerated procedure well without immediate complications

## 2022-04-17 ENCOUNTER — Other Ambulatory Visit: Payer: Self-pay | Admitting: Sports Medicine

## 2022-04-17 DIAGNOSIS — M25512 Pain in left shoulder: Secondary | ICD-10-CM

## 2022-05-11 ENCOUNTER — Ambulatory Visit
Admission: RE | Admit: 2022-05-11 | Discharge: 2022-05-11 | Disposition: A | Discharge status: Home / Self Care | Payer: BC Managed Care – PPO | Source: Ambulatory Visit | Attending: Sports Medicine | Admitting: Sports Medicine

## 2022-05-11 DIAGNOSIS — M25512 Pain in left shoulder: Secondary | ICD-10-CM

## 2023-10-22 ENCOUNTER — Other Ambulatory Visit: Payer: Self-pay | Admitting: Internal Medicine

## 2023-10-22 DIAGNOSIS — J329 Chronic sinusitis, unspecified: Secondary | ICD-10-CM

## 2024-07-23 ENCOUNTER — Ambulatory Visit: Admitting: Diagnostic Neuroimaging
